# Patient Record
Sex: Male | Born: 2000 | Race: White | Hispanic: No | Marital: Single | Smoking: Never smoker
Health system: Southern US, Community
[De-identification: ages and names within clinical notes are randomized; demographics above are authoritative.]

## PROBLEM LIST (undated history)

## (undated) HISTORY — PX: WISDOM TOOTH EXTRACTION: SHX21

## (undated) HISTORY — PX: ADENOIDECTOMY: SUR15

---
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
## 2022-05-27 ENCOUNTER — Encounter: Payer: Self-pay | Admitting: Emergency Medicine

## 2022-05-27 ENCOUNTER — Ambulatory Visit
Admission: EM | Admit: 2022-05-27 | Discharge: 2022-05-27 | Disposition: A | Discharge status: Home / Self Care | Payer: BC Managed Care – PPO | Attending: Family Medicine | Admitting: Family Medicine

## 2022-05-27 DIAGNOSIS — U071 COVID-19: Secondary | ICD-10-CM | POA: Insufficient documentation

## 2022-05-27 LAB — SARS CORONAVIRUS 2 BY RT PCR: SARS Coronavirus 2 by RT PCR: POSITIVE — AB

## 2022-05-27 MED ORDER — MOLNUPIRAVIR EUA 200MG CAPSULE: 4.0000 | ORAL_CAPSULE | Freq: Two times a day (BID) | ORAL | 0 refills | Status: AC

## 2022-05-27 NOTE — Discharge Instructions (Signed)
Your test for COVID-19 was positive, meaning that you were infected with the novel coronavirus and could give the germ to others.  Please continue isolation at home for at least 5 days since the start of your symptoms. Once you complete your 5 day quarantine, you may return to normal activities as long as you've not had a fever for over 24 hours(without taking fever reducing medicine) and your symptoms are improving. Be sure to wear a mask until Day 11.   If your were prescribed medication. Stop by the pharmacy to pick it up.   Please continue good preventive care measures, including:  frequent hand-washing, avoid touching your face, cover coughs/sneezes, stay out of crowds and keep a 6 foot distance from others.  Go to the nearest hospital emergency room if fever/cough/breathlessness are severe or illness seems like a threat to life.  You can take Tylenol and/or Ibuprofen as needed for fever reduction and pain relief.    For cough: honey 1/2 to 1 teaspoon (you can dilute the honey in water or another fluid).  You can also use guaifenesin and dextromethorphan for cough. You can use a humidifier for chest congestion and cough.  If you don't have a humidifier, you can sit in the bathroom with the hot shower running.      For sore throat: try warm salt water gargles, Mucinex sore throat cough drops or cepacol lozenges, throat spray, warm tea or water with lemon/honey, popsicles or ice, or OTC cold relief medicine for throat discomfort. You can also purchase chloraseptic spray at the pharmacy or dollar store.   For congestion: take a daily anti-histamine like Zyrtec, Claritin, and a oral decongestant, such as pseudoephedrine.  You can also use Flonase 1-2 sprays in each nostril daily. Afrin is also a good option, if you do not have high blood pressure.    It is important to stay hydrated: drink plenty of fluids (water, gatorade/powerade/pedialyte, juices, or teas) to keep your throat moisturized and help  further relieve irritation/discomfort.    Return or go to the Emergency Department if symptoms worsen or do not improve in the next few days  

## 2022-05-27 NOTE — ED Triage Notes (Signed)
Patient c/o cough, runny nose, headache, and bodyaches that started yesterday.  Patient reports fevers.  Patient took home test and was positive today.

## 2022-05-27 NOTE — ED Provider Notes (Signed)
[facility]    CSN: [id number] Arrival date & time: 05/27/22  1417      History   Chief Complaint Chief Complaint  Patient presents with   Nasal Congestion    Covid Positive    HPI DENG MCNEFF is a 21 y.o. male.   HPI   Sheadon presents after COVID test this morning which was positive.  Yesterday, he woke up with body aches, headache, sore throat, nasal congestion, rhinorrhea. Fever 102 F this morning.  Denies belly pain, chest discomfort, shortness of breath, vomiting, diarrhea. No medication for symptoms.  He is allergic to cats and there are 3 at home. His aunt has COVID and tested positive after their interaction.     History reviewed. No pertinent past medical history.  There are no problems to display for this patient.   Past Surgical History:  Procedure Laterality Date   ADENOIDECTOMY     WISDOM TOOTH EXTRACTION         Home Medications    Prior to Admission medications   Medication Sig Start Date End Date Taking? Authorizing Provider  molnupiravir EUA (LAGEVRIO) 200 mg CAPS capsule Take 4 capsules (800 mg total) by mouth 2 (two) times daily for 5 days. 05/27/22 06/01/22 Yes Lyndee Hensen, DO    Family History History reviewed. No pertinent family history.  Social History Social History   Tobacco Use   Smoking status: Never   Smokeless tobacco: Never  Vaping Use   Vaping Use: Never used  Substance Use Topics   Alcohol use: Not Currently   Drug use: Never     Allergies   Doxycycline   Review of Systems Review of Systems: negative unless otherwise stated in HPI.      Physical Exam Triage Vital Signs ED Triage Vitals  Enc Vitals Group     BP 05/27/22 1502 117/72     Pulse Rate 05/27/22 1502 (!) 114     Resp 05/27/22 1502 15     Temp 05/27/22 1502 98.8 F (37.1 C)     Temp Source 05/27/22 1502 Oral     SpO2 05/27/22 1502 99 %     Weight 05/27/22 1500 140 lb (63.5 kg)     Height 05/27/22 1500 5\' 8"  (1.727 m)     Head  Circumference --      Peak Flow --      Pain Score 05/27/22 1459 6     Pain Loc --      Pain Edu? --      Excl. in [place]? --    No data found.  Updated Vital Signs BP 117/72 (BP Location: Left Arm)   Pulse (!) 114   Temp 98.8 F (37.1 C) (Oral)   Resp 15   Ht 5\' 8"  (1.727 m)   Wt 63.5 kg   SpO2 99%   BMI 21.29 kg/m   Visual Acuity Right Eye Distance:   Left Eye Distance:   Bilateral Distance:    Right Eye Near:   Left Eye Near:    Bilateral Near:     Physical Exam GEN:     alert, non-toxic appearing male in no distress    HENT:  mucus membranes moist, oropharyngeal without lesions or exudate, no tonsillar hypertrophy, mild oropharyngeal erythema, moderate erythematous edematous turbinates, clear nasal discharge, bilateral TM normal EYES:   pupils equal and reactive, no scleral injection or discharge NECK:  normal ROM, no lymphadenopathy, no meningismus   RESP:  no increased work  of breathing, clear to auscultation bilaterally CVS:   regular rate rhythm, tachycardic Skin:   warm and dry, no rash on visible skin    UC Treatments / Results  Labs (all labs ordered are listed, but only abnormal results are displayed) Labs Reviewed  SARS CORONAVIRUS 2 BY RT PCR - Abnormal; Notable for the following components:      Result Value   SARS Coronavirus 2 by RT PCR POSITIVE (*)    All other components within normal limits    EKG   Radiology No results found.  Procedures Procedures (including critical care time)  Medications Ordered in UC Medications - No data to display  Initial Impression / Assessment and Plan / UC Course  I have reviewed the triage vital signs and the nursing notes.  Pertinent labs & imaging results that were available during my care of the patient were reviewed by me and considered in my medical decision making (see chart for details).       Pt is a 21 y.o. male who presents for 1 days of respiratory symptoms. Akshay is afebrile however he  was drinking a cold Pepsi upon arrival with his temperature was taken.  He is tachycardic I suspect that he does have the underlying fever given the report of a fever of 102 this morning without antipyretics. Satting well on room air. Overall pt is non-toxic appearing, well hydrated, without respiratory distress. Pulmonary exam is unremarkable.  COVID  obtained and was positive.  He has a close familial contact.  Mom is interested in getting COVID treatment and patient's current Crase.  He is within the treatment window.  After shared decision making with his mom they chose to go with molnupiravir instead of Paxlovid.  Prescription sent to the pharmacy.  Discussed symptomatic treatment.  Typical duration of symptoms discussed.  Isolation and quarantine instructions given.  Work note provided.  Return and ED precautions given and voiced understanding. Discussed MDM, treatment plan and plan for follow-up with patient/mom who agrees with plan.     Final Clinical Impressions(s) / UC Diagnoses   Final diagnoses:  [id number]     Discharge Instructions      Your test for COVID-19 was positive, meaning that you were infected with the novel coronavirus and could give the germ to others.  Please continue isolation at home for at least 5 days since the start of your symptoms. Once you complete your 5 day quarantine, you may return to normal activities as long as you've not had a fever for over 24 hours(without taking fever reducing medicine) and your symptoms are improving. Be sure to wear a mask until Day 11.   If your were prescribed medication. Stop by the pharmacy to pick it up.   Please continue good preventive care measures, including:  frequent hand-washing, avoid touching your face, cover coughs/sneezes, stay out of crowds and keep a 6 foot distance from others.  Go to the nearest hospital emergency room if fever/cough/breathlessness are severe or illness seems like a threat to life.  You can take  Tylenol and/or Ibuprofen as needed for fever reduction and pain relief.    For cough: honey 1/2 to 1 teaspoon (you can dilute the honey in water or another fluid).  You can also use guaifenesin and dextromethorphan for cough. You can use a humidifier for chest congestion and cough.  If you don't have a humidifier, you can sit in the bathroom with the hot shower running.  For sore throat: try warm salt water gargles, Mucinex sore throat cough drops or cepacol lozenges, throat spray, warm tea or water with lemon/honey, popsicles or ice, or OTC cold relief medicine for throat discomfort. You can also purchase chloraseptic spray at the pharmacy or dollar store.   For congestion: take a daily anti-histamine like Zyrtec, Claritin, and a oral decongestant, such as pseudoephedrine.  You can also use Flonase 1-2 sprays in each nostril daily. Afrin is also a good option, if you do not have high blood pressure.    It is important to stay hydrated: drink plenty of fluids (water, gatorade/powerade/pedialyte, juices, or teas) to keep your throat moisturized and help further relieve irritation/discomfort.    Return or go to the Emergency Department if symptoms worsen or do not improve in the next few days      ED Prescriptions     Medication Sig Dispense Auth. Provider   molnupiravir EUA (LAGEVRIO) 200 mg CAPS capsule Take 4 capsules (800 mg total) by mouth 2 (two) times daily for 5 days. 40 capsule Lidwina Kaner, DO      PDMP not reviewed this encounter.   Katha Cabal, DO 05/28/22 1206
